# Patient Record
Sex: Male | Born: 1950 | Race: White | Hispanic: No | Smoking: Current every day smoker
Health system: Southern US, Community
[De-identification: ages and names within clinical notes are randomized; demographics above are authoritative.]

## PROBLEM LIST (undated history)

## (undated) ENCOUNTER — Emergency Department (HOSPITAL_COMMUNITY): Admission: EM | Payer: Self-pay | Source: Home / Self Care

## (undated) DIAGNOSIS — I10 Essential (primary) hypertension: Secondary | ICD-10-CM

---

## 1999-02-10 ENCOUNTER — Ambulatory Visit (HOSPITAL_COMMUNITY): Admission: RE | Admit: 1999-02-10 | Discharge: 1999-02-10 | Payer: Self-pay | Admitting: Endocrinology

## 1999-02-10 ENCOUNTER — Encounter: Payer: Self-pay | Admitting: Endocrinology

## 1999-02-11 ENCOUNTER — Ambulatory Visit (HOSPITAL_COMMUNITY): Admission: RE | Admit: 1999-02-11 | Discharge: 1999-02-11 | Payer: Self-pay | Admitting: Endocrinology

## 1999-02-11 ENCOUNTER — Encounter: Payer: Self-pay | Admitting: Endocrinology

## 2000-05-17 ENCOUNTER — Other Ambulatory Visit: Admission: RE | Admit: 2000-05-17 | Discharge: 2000-05-17 | Payer: Self-pay | Admitting: General Surgery

## 2002-12-26 ENCOUNTER — Emergency Department (HOSPITAL_COMMUNITY): Admission: EM | Admit: 2002-12-26 | Discharge: 2002-12-26 | Payer: Self-pay | Admitting: Emergency Medicine

## 2002-12-26 ENCOUNTER — Encounter: Payer: Self-pay | Admitting: Emergency Medicine

## 2009-01-11 ENCOUNTER — Inpatient Hospital Stay (HOSPITAL_COMMUNITY): Admission: RE | Admit: 2009-01-11 | Discharge: 2009-01-13 | Payer: Self-pay | Admitting: *Deleted

## 2009-01-11 ENCOUNTER — Ambulatory Visit: Payer: Self-pay | Admitting: *Deleted

## 2009-01-11 ENCOUNTER — Emergency Department (HOSPITAL_COMMUNITY): Admission: EM | Admit: 2009-01-11 | Discharge: 2009-01-11 | Payer: Self-pay | Admitting: Emergency Medicine

## 2010-10-20 LAB — HEPATIC FUNCTION PANEL
ALT: 43 U/L (ref 0–53)
AST: 33 U/L (ref 0–37)
AST: 39 U/L — ABNORMAL HIGH (ref 0–37)
Albumin: 3.8 g/dL (ref 3.5–5.2)
Albumin: 4.1 g/dL (ref 3.5–5.2)
Alkaline Phosphatase: 49 U/L (ref 39–117)
Bilirubin, Direct: 0.2 mg/dL (ref 0.0–0.3)
Indirect Bilirubin: 0.8 mg/dL (ref 0.3–0.9)
Total Bilirubin: 1 mg/dL (ref 0.3–1.2)
Total Bilirubin: 1 mg/dL (ref 0.3–1.2)
Total Protein: 6.6 g/dL (ref 6.0–8.3)
Total Protein: 7 g/dL (ref 6.0–8.3)

## 2010-10-20 LAB — CBC
MCV: 108.5 fL — ABNORMAL HIGH (ref 78.0–100.0)
Platelets: 171 10*3/uL (ref 150–400)
RBC: 4.72 MIL/uL (ref 4.22–5.81)
WBC: 11.8 10*3/uL — ABNORMAL HIGH (ref 4.0–10.5)

## 2010-10-20 LAB — BASIC METABOLIC PANEL
BUN: 17 mg/dL (ref 6–23)
Chloride: 108 mEq/L (ref 96–112)
Creatinine, Ser: 0.88 mg/dL (ref 0.4–1.5)
GFR calc Af Amer: >60 mL/min (ref >60)
GFR calc non Af Amer: >60 mL/min (ref >60)

## 2010-10-20 LAB — RAPID URINE DRUG SCREEN, HOSP PERFORMED
Amphetamines: NOT DETECTED
Barbiturates: NOT DETECTED
Benzodiazepines: NOT DETECTED
Cocaine: NOT DETECTED
Opiates: NOT DETECTED
Tetrahydrocannabinol: NOT DETECTED

## 2010-10-20 LAB — ETHANOL: Alcohol, Ethyl (B): 55 mg/dL — ABNORMAL HIGH (ref 0–10)

## 2010-10-20 LAB — DIFFERENTIAL
Eosinophils Absolute: 0.2 10*3/uL (ref 0.0–0.7)
Lymphocytes Relative: 20 % (ref 12–46)
Lymphs Abs: 2.4 10*3/uL (ref 0.7–4.0)
Monocytes Relative: 5 % (ref 3–12)
Neutro Abs: 8.6 10*3/uL — ABNORMAL HIGH (ref 1.7–7.7)
Neutrophils Relative %: 73 % (ref 43–77)

## 2010-10-20 LAB — SALICYLATE LEVEL: Salicylate Lvl: <4 mg/dL (ref 2.8–20.0)

## 2010-10-20 LAB — ACETAMINOPHEN LEVEL: Acetaminophen (Tylenol), Serum: <10 ug/mL — ABNORMAL LOW (ref 10–30)

## 2010-10-20 LAB — TSH: TSH: 1.703 u[IU]/mL (ref 0.350–4.500)

## 2010-10-20 LAB — TRICYCLICS SCREEN, URINE: TCA Scrn: POSITIVE — AB

## 2010-11-28 NOTE — Discharge Summary (Signed)
NAME:  Ricky [facility], RADELL NO.:  [id number]   MEDICAL RECORD NO.:  [id number]          PATIENT TYPE:  IPS   LOCATION:  0301                          FACILITY:  [facility]   PHYSICIAN:  Geoffery Lyons, M.D.      DATE OF BIRTH:  [birth year]   DATE OF ADMISSION:  01/11/2009  DATE OF DISCHARGE:  01/13/2009                               DISCHARGE SUMMARY   IDENTIFYING INFORMATION:  A 60 year old single male.  This is a  voluntary admission.   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  He presented after overdosing on  approximately 32 tablets of Unisom, an over-the-counter sleeping pill.  He endorsed that it was a suicide attempt.  He had had thoughts of  suicide for a couple days, feeling that the previous weeks' events had  pushed him over the edge.  He took the overdose at night, and his  brother found him confused the following morning.  He endorsed that he  had drunk some alcohol.  He reported that he had had a lot of financial  stressors.  His house was being foreclosed on because he had not gotten  any money out of the business over the past year, missed the mortgage,  thought that he had a plan in place to have the mortgage changed then  discovered that he had missed a deadline.  Then his ex-wife got in touch  with him and was suing him for lack of payment on his part of an equity  line that they owed and initially thought that it was 67,000 dollars,  feeling unable to cope with these stressors and that he could no longer  live.   PAST PSYCHIATRIC HISTORY:  Remarkable for a history of alcohol use,  typically drinking two beers a day near the end of the day as a way to  relax.  No issues with intoxication.  He had a distant history of a  previous DWI, none in several years.  No  prior history of suicide  attempts.  No history of prior psychiatric treatment or counseling.   LABORATORIES:  He was medically stabilized in the emergency room where a  urine drug screen was negative for all substances.  Electrolytes within  normal limits.  CBC:  WBC 11.8, hemoglobin 17.5, hematocrit 51.2 and MCV  108.5.  Liver enzymes revealed a mildly elevated SGOT of 39, normal  SGOT, alkaline phosphatase and total bilirubin.   DRUG ALLERGIES:  None.   ADMITTING MEDICATIONS:  1. Diovan 320 mg once daily.  2. Atenolol 25 mg b.i.d.   ADMITTING MENTAL STATUS EXAM:  Revealed a fully alert male, cooperative,  rather embarrassed by what he did, denying any dangerous thoughts.   HOSPITAL COURSE:  On reflection, he thought that he did not owe as much  money as he initially had complained of owing.  He still had  considerable stressors with a  large amount of money on the equity line  that he owed his wife.  He said that there would be no way he could go  back to his home, that it would definitely be foreclosed on.  He felt  encouraged by the fact that he had spoken with his father who wanted him  to come and live with him and stay in the spare room.  His brother was  also supportive.  He felt like his family was there for him.  By the  4th, he was fully alert.  No signs of alcohol withdrawal.  He was  requesting to be discharged.  No dangerous ideas.  Family was  supportive, wanted him to stay with them.  He was agreeable to consider  counseling.  Endorsed that he was going to stay away from alcohol.  He  was not started on any medications.   DISCHARGE MENTAL STATUS EXAM:  Revealed a fully alert male, fully  oriented.  No dangerous ideas.  No evidence of delirium or withdrawal.   DISCHARGE DIAGNOSES:  AXIS I:  Depressive disorder NOS.  Alcohol  intoxication.  AXIS II:  No diagnosis.  AXIS III:  Hypertension, status post diphenhydramine overdose.  AXIS IV: Significant financial  issues and business losses.  Supportive  family is an asset.  AXIS V:  Current GAF 62, past year 41.   DISCHARGE MEDICATIONS:  We discharged him home on his current  medications of atenolol 25 mg b.i.d. and Diovan 320 mg daily.  No new  medications started here.   FOLLOWUP:  He was agreeable to go to counseling, and our counselor will  call him with referral since this is the July 4th holiday.      Margaret A. Scott, N.P.      Geoffery Lyons, M.D.  Electronically Signed    MAS/MEDQ  D:  01/14/2009  T:  01/14/2009  Job:  [id number]

## 2012-09-01 ENCOUNTER — Emergency Department (HOSPITAL_COMMUNITY): Payer: Self-pay

## 2012-09-01 ENCOUNTER — Inpatient Hospital Stay (HOSPITAL_COMMUNITY)
Admission: EM | Admit: 2012-09-01 | Discharge: 2012-09-05 | DRG: 071 | Disposition: A | Discharge status: Home / Self Care | Payer: Self-pay | Attending: Internal Medicine | Admitting: Internal Medicine

## 2012-09-01 ENCOUNTER — Encounter (HOSPITAL_COMMUNITY): Payer: Self-pay | Admitting: *Deleted

## 2012-09-01 DIAGNOSIS — F1910 Other psychoactive substance abuse, uncomplicated: Secondary | ICD-10-CM | POA: Diagnosis present

## 2012-09-01 DIAGNOSIS — F1020 Alcohol dependence, uncomplicated: Secondary | ICD-10-CM | POA: Diagnosis present

## 2012-09-01 DIAGNOSIS — E876 Hypokalemia: Secondary | ICD-10-CM | POA: Diagnosis present

## 2012-09-01 DIAGNOSIS — F10239 Alcohol dependence with withdrawal, unspecified: Secondary | ICD-10-CM

## 2012-09-01 DIAGNOSIS — Z79899 Other long term (current) drug therapy: Secondary | ICD-10-CM

## 2012-09-01 DIAGNOSIS — D7589 Other specified diseases of blood and blood-forming organs: Secondary | ICD-10-CM

## 2012-09-01 DIAGNOSIS — G9340 Encephalopathy, unspecified: Principal | ICD-10-CM | POA: Diagnosis present

## 2012-09-01 DIAGNOSIS — F10939 Alcohol use, unspecified with withdrawal, unspecified: Secondary | ICD-10-CM | POA: Diagnosis present

## 2012-09-01 DIAGNOSIS — D6959 Other secondary thrombocytopenia: Secondary | ICD-10-CM | POA: Diagnosis present

## 2012-09-01 DIAGNOSIS — Z7982 Long term (current) use of aspirin: Secondary | ICD-10-CM

## 2012-09-01 DIAGNOSIS — D696 Thrombocytopenia, unspecified: Secondary | ICD-10-CM | POA: Diagnosis present

## 2012-09-01 HISTORY — DX: Essential (primary) hypertension: I10

## 2012-09-01 LAB — COMPREHENSIVE METABOLIC PANEL
ALT: 62 U/L — ABNORMAL HIGH (ref 0–53)
ALT: 57 U/L — ABNORMAL HIGH (ref 0–53)
Alkaline Phosphatase: 136 U/L — ABNORMAL HIGH (ref 39–117)
BUN: 14 mg/dL (ref 6–23)
BUN: 21 mg/dL (ref 6–23)
CO2: 23 mEq/L (ref 19–32)
Calcium: 9.1 mg/dL (ref 8.4–10.5)
Calcium: 8.9 mg/dL (ref 8.4–10.5)
GFR calc Af Amer: >90 mL/min (ref >90)
GFR calc Af Amer: >90 mL/min (ref >90)
GFR calc non Af Amer: >90 mL/min (ref >90)
Glucose, Bld: 78 mg/dL (ref 70–99)
Glucose, Bld: 80 mg/dL (ref 70–99)
Potassium: 3.3 mEq/L — ABNORMAL LOW (ref 3.5–5.1)
Sodium: 136 mEq/L (ref 135–145)
Sodium: 140 mEq/L (ref 135–145)
Total Protein: 6.9 g/dL (ref 6.0–8.3)

## 2012-09-01 LAB — RAPID URINE DRUG SCREEN, HOSP PERFORMED
Amphetamines: NOT DETECTED
Benzodiazepines: NOT DETECTED
Opiates: NOT DETECTED

## 2012-09-01 LAB — CBC WITH DIFFERENTIAL/PLATELET
Basophils Absolute: 0 10*3/uL (ref 0.0–0.1)
Basophils Relative: 0 % (ref 0–1)
Eosinophils Relative: 0 % (ref 0–5)
Lymphocytes Relative: 11 % — ABNORMAL LOW (ref 12–46)
MCHC: 36.1 g/dL — ABNORMAL HIGH (ref 30.0–36.0)
Monocytes Relative: 14 % — ABNORMAL HIGH (ref 3–12)
Neutro Abs: 5.8 10*3/uL (ref 1.7–7.7)
Neutrophils Relative %: 79 % — ABNORMAL HIGH (ref 43–77)
Neutrophils Relative %: 62 % (ref 43–77)
Platelets: 95 10*3/uL — ABNORMAL LOW (ref 150–400)
RBC: 3.73 MIL/uL — ABNORMAL LOW (ref 4.22–5.81)
RDW: 12 % (ref 11.5–15.5)
WBC: 7.4 10*3/uL (ref 4.0–10.5)
WBC: 6.2 10*3/uL (ref 4.0–10.5)

## 2012-09-01 LAB — APTT: aPTT: 31 seconds (ref 24–37)

## 2012-09-01 LAB — URINALYSIS, ROUTINE W REFLEX MICROSCOPIC
Glucose, UA: NEGATIVE mg/dL
Hgb urine dipstick: NEGATIVE
Protein, ur: 100 mg/dL — AB
Specific Gravity, Urine: 1.026 (ref 1.005–1.030)
pH: 5 (ref 5.0–8.0)

## 2012-09-01 LAB — MAGNESIUM
Magnesium: 1.7 mg/dL (ref 1.5–2.5)
Magnesium: 1.8 mg/dL (ref 1.5–2.5)

## 2012-09-01 LAB — ETHANOL: Alcohol, Ethyl (B): <11 mg/dL (ref 0–11)

## 2012-09-01 LAB — ACETAMINOPHEN LEVEL: Acetaminophen (Tylenol), Serum: <15 ug/mL (ref 10–30)

## 2012-09-01 LAB — PHOSPHORUS: Phosphorus: 4.1 mg/dL (ref 2.3–4.6)

## 2012-09-01 LAB — PROTIME-INR
INR: 1.14 (ref 0.00–1.49)
INR: 1.13 (ref 0.00–1.49)

## 2012-09-01 LAB — URINE MICROSCOPIC-ADD ON

## 2012-09-01 MED ORDER — LACTULOSE 10 GM/15ML PO SOLN
10.0000 g | Freq: Two times a day (BID) | ORAL | Status: DC | PRN
  Filled 2012-09-01: qty 15

## 2012-09-01 MED ORDER — OMEGA-3 FATTY ACIDS 1000 MG PO CAPS: 1.0000 g | ORAL_CAPSULE | Freq: Three times a day (TID) | ORAL | Status: DC

## 2012-09-01 MED ORDER — LORAZEPAM 2 MG/ML IJ SOLN: 2.0000 mg | Freq: Four times a day (QID) | INTRAMUSCULAR | Status: DC | PRN

## 2012-09-01 MED ORDER — POTASSIUM CHLORIDE CRYS ER 20 MEQ PO TBCR: 40.0000 meq | EXTENDED_RELEASE_TABLET | Freq: Once | ORAL | Status: DC

## 2012-09-01 MED ORDER — POTASSIUM CHLORIDE CRYS ER 20 MEQ PO TBCR
40.0000 meq | EXTENDED_RELEASE_TABLET | Freq: Once | ORAL | Status: AC
  Administered 2012-09-01: 40 meq via ORAL
  Filled 2012-09-01: qty 2

## 2012-09-01 MED ORDER — VITAMIN B-1 100 MG PO TABS
100.0000 mg | ORAL_TABLET | Freq: Every day | ORAL | Status: DC
  Administered 2012-09-02 – 2012-09-05 (×4): 100 mg via ORAL
  Filled 2012-09-01 (×4): qty 1

## 2012-09-01 MED ORDER — FOLIC ACID 1 MG PO TABS
1.0000 mg | ORAL_TABLET | Freq: Every day | ORAL | Status: DC
  Administered 2012-09-04: 1 mg via ORAL
  Filled 2012-09-01 (×4): qty 1

## 2012-09-01 MED ORDER — ADULT MULTIVITAMIN W/MINERALS CH
1.0000 | ORAL_TABLET | Freq: Every day | ORAL | Status: DC
  Administered 2012-09-01 – 2012-09-05 (×5): 1 via ORAL
  Filled 2012-09-01 (×5): qty 1

## 2012-09-01 MED ORDER — ACETAMINOPHEN 650 MG RE SUPP: 650.0000 mg | Freq: Four times a day (QID) | RECTAL | Status: DC | PRN

## 2012-09-01 MED ORDER — LORAZEPAM 1 MG PO TABS: 1.0000 mg | ORAL_TABLET | Freq: Four times a day (QID) | ORAL | Status: DC | PRN

## 2012-09-01 MED ORDER — THIAMINE HCL 100 MG/ML IJ SOLN
100.0000 mg | Freq: Every day | INTRAMUSCULAR | Status: DC
  Administered 2012-09-01: 100 mg via INTRAVENOUS
  Filled 2012-09-01: qty 1
  Filled 2012-09-01: qty 2

## 2012-09-01 MED ORDER — LORAZEPAM 2 MG/ML IJ SOLN
0.0000 mg | Freq: Four times a day (QID) | INTRAMUSCULAR | Status: AC
  Administered 2012-09-01: 4 mg via INTRAVENOUS
  Administered 2012-09-02: 1 mg via INTRAVENOUS
  Administered 2012-09-02 (×3): 2 mg via INTRAVENOUS
  Administered 2012-09-03: 3 mg via INTRAVENOUS
  Administered 2012-09-03: 2 mg via INTRAVENOUS
  Filled 2012-09-01 (×2): qty 1
  Filled 2012-09-01: qty 2
  Filled 2012-09-01 (×3): qty 1
  Filled 2012-09-01: qty 2
  Filled 2012-09-01: qty 1

## 2012-09-01 MED ORDER — B COMPLEX-C PO TABS
1.0000 | ORAL_TABLET | Freq: Every day | ORAL | Status: DC
  Administered 2012-09-01 – 2012-09-05 (×5): 1 via ORAL
  Filled 2012-09-01 (×5): qty 1

## 2012-09-01 MED ORDER — ADULT MULTIVITAMIN W/MINERALS CH
1.0000 | ORAL_TABLET | Freq: Every day | ORAL | Status: DC
  Filled 2012-09-01: qty 1

## 2012-09-01 MED ORDER — ADULT MULTIVITAMIN W/MINERALS CH
1.0000 | ORAL_TABLET | Freq: Once | ORAL | Status: AC
  Administered 2012-09-01: 1 via ORAL
  Filled 2012-09-01: qty 1

## 2012-09-01 MED ORDER — THIAMINE HCL 100 MG/ML IJ SOLN
100.0000 mg | Freq: Every day | INTRAMUSCULAR | Status: DC
  Filled 2012-09-01 (×4): qty 1

## 2012-09-01 MED ORDER — FOLIC ACID 5 MG/ML IJ SOLN
1.0000 mg | Freq: Once | INTRAMUSCULAR | Status: AC
  Administered 2012-09-01: 1 mg via INTRAVENOUS
  Filled 2012-09-01: qty 0.2

## 2012-09-01 MED ORDER — THIAMINE HCL 100 MG/ML IJ SOLN
100.0000 mg | Freq: Every day | INTRAMUSCULAR | Status: DC
  Filled 2012-09-01: qty 1

## 2012-09-01 MED ORDER — LORAZEPAM 2 MG/ML IJ SOLN
1.0000 mg | Freq: Four times a day (QID) | INTRAMUSCULAR | Status: DC | PRN
  Administered 2012-09-01: 1 mg via INTRAVENOUS
  Filled 2012-09-01: qty 1

## 2012-09-01 MED ORDER — ATENOLOL 25 MG PO TABS
25.0000 mg | ORAL_TABLET | Freq: Two times a day (BID) | ORAL | Status: DC
  Administered 2012-09-02 – 2012-09-03 (×4): 25 mg via ORAL
  Filled 2012-09-01 (×7): qty 1

## 2012-09-01 MED ORDER — LORAZEPAM 1 MG PO TABS
1.0000 mg | ORAL_TABLET | Freq: Four times a day (QID) | ORAL | Status: DC | PRN
  Administered 2012-09-04: 1 mg via ORAL
  Filled 2012-09-01: qty 1

## 2012-09-01 MED ORDER — VITAMIN B-1 100 MG PO TABS
100.0000 mg | ORAL_TABLET | Freq: Every day | ORAL | Status: DC
  Filled 2012-09-01: qty 1

## 2012-09-01 MED ORDER — NIACIN 500 MG PO TABS
500.0000 mg | ORAL_TABLET | Freq: Two times a day (BID) | ORAL | Status: DC
  Administered 2012-09-01 – 2012-09-05 (×8): 500 mg via ORAL
  Filled 2012-09-01 (×11): qty 1

## 2012-09-01 MED ORDER — SODIUM CHLORIDE 0.9 % IJ SOLN
3.0000 mL | Freq: Two times a day (BID) | INTRAMUSCULAR | Status: DC
  Administered 2012-09-01 – 2012-09-04 (×4): 3 mL via INTRAVENOUS

## 2012-09-01 MED ORDER — LORAZEPAM 2 MG/ML IJ SOLN
0.0000 mg | Freq: Two times a day (BID) | INTRAMUSCULAR | Status: DC
  Administered 2012-09-03: 2 mg via INTRAVENOUS
  Filled 2012-09-01: qty 2

## 2012-09-01 MED ORDER — THIAMINE HCL 100 MG/ML IJ SOLN: Freq: Once | INTRAVENOUS | Status: DC

## 2012-09-01 MED ORDER — SODIUM CHLORIDE 0.9 % IV SOLN
INTRAVENOUS | Status: DC
  Administered 2012-09-01 – 2012-09-03 (×4): via INTRAVENOUS

## 2012-09-01 MED ORDER — ONDANSETRON HCL 4 MG/2ML IJ SOLN: 4.0000 mg | Freq: Four times a day (QID) | INTRAMUSCULAR | Status: DC | PRN

## 2012-09-01 MED ORDER — LORAZEPAM 2 MG/ML IJ SOLN: 1.0000 mg | Freq: Four times a day (QID) | INTRAMUSCULAR | Status: DC | PRN

## 2012-09-01 MED ORDER — LORAZEPAM 2 MG/ML IJ SOLN
2.0000 mg | INTRAMUSCULAR | Status: DC | PRN
  Administered 2012-09-01 – 2012-09-03 (×9): 2 mg via INTRAVENOUS
  Filled 2012-09-01 (×6): qty 1
  Filled 2012-09-01: qty 2

## 2012-09-01 MED ORDER — FOLIC ACID 1 MG PO TABS
1.0000 mg | ORAL_TABLET | Freq: Every day | ORAL | Status: DC
  Administered 2012-09-02 – 2012-09-05 (×3): 1 mg via ORAL
  Filled 2012-09-01 (×4): qty 1

## 2012-09-01 MED ORDER — ONDANSETRON HCL 4 MG PO TABS: 4.0000 mg | ORAL_TABLET | Freq: Four times a day (QID) | ORAL | Status: DC | PRN

## 2012-09-01 MED ORDER — ASPIRIN EC 81 MG PO TBEC
81.0000 mg | DELAYED_RELEASE_TABLET | Freq: Every day | ORAL | Status: DC
Start: 2012-09-01 — End: 2012-09-05
  Administered 2012-09-01 – 2012-09-05 (×5): 81 mg via ORAL
  Filled 2012-09-01 (×5): qty 1

## 2012-09-01 MED ORDER — ACETAMINOPHEN 325 MG PO TABS: 650.0000 mg | ORAL_TABLET | Freq: Four times a day (QID) | ORAL | Status: DC | PRN

## 2012-09-01 MED ORDER — LORAZEPAM 1 MG PO TABS
1.0000 mg | ORAL_TABLET | Freq: Four times a day (QID) | ORAL | Status: DC | PRN
  Administered 2012-09-01: 1 mg via ORAL
  Filled 2012-09-01 (×2): qty 1

## 2012-09-01 MED ORDER — OMEGA-3-ACID ETHYL ESTERS 1 G PO CAPS
1.0000 g | ORAL_CAPSULE | Freq: Three times a day (TID) | ORAL | Status: DC
  Administered 2012-09-02 – 2012-09-05 (×10): 1 g via ORAL
  Filled 2012-09-01 (×13): qty 1

## 2012-09-01 NOTE — ED Notes (Signed)
AOZ:[id number]<VH> Expected date:09/01/12<BR> Expected time: 8:17 AM<BR> Means of arrival:Ambulance<BR> Comments:<BR> Confused found walking out side

## 2012-09-01 NOTE — ED Notes (Signed)
Per EMS: pt seen walking outside in his underwear, neighbor called [employer], per EMS pt is having visual hallucinations, pt reports seeing dogs, snow falling etc. Per EMS pt admits to being alcoholic, last drink last night, per EMS pt's sister reports no known psych issues.

## 2012-09-01 NOTE — Progress Notes (Signed)
CM noted CM consult for trouble with medications Pt is listed as self pay but states he has a pcp CM spoke with pt, his sister and his brother prior to transfer to unit Pt states also he has seen Dr Casimiro Needle rigby Reports today being "foggy in understanding"   Cm answered questions for sister about health reform deadline of 10/10/12 Cm reviewed with pt, sister and brother the chs [facility] program and that pt may be determined to be a candidate at time of d/c and offered d/c medications for co pay of $3 per Rx.  CM reviewed needymeds.org site for assess to patient assistance programs for uninsured patients CM provided written information for needymeds.org and the pt assistance applications for diovan and atenolol Pt has a bag of medications with him His sister clarifies that pt takes only 2 Rx meds (diovan and atenolol) and vitamins but he had stopped taking the 2 Rx medications related to cost after he lost his job He previously obtained them for $4 cost but increased to $30-60 per pt

## 2012-09-01 NOTE — ED Provider Notes (Signed)
History     CSN: [id number]  Arrival date & time 09/01/12  4098   First MD Initiated Contact with Patient 09/01/12 [phone]      Chief Complaint  Patient presents with  . Altered Mental Status    (Consider location/radiation/quality/duration/timing/severity/associated sxs/prior treatment) HPI Comments: 62 y.o male PMH alcohol abuse, h/o drug overdose, h/o SI, h/o HTN.  He presents for AMS.  He was found walking down the street in his underwear. His neighbor called police.  EMS stated the patient was seeing dogs and snowflakes.  Sister says she spoke with the patient yesterday and he sounded confused.   The patient stated he has a recent history of fall (a couple of days ago) walking the dog remembers hitting his arm and falling on right hip and knee but he is unsure if he hit his head.  He states today he is hallucinating seeing snowflakes today.  He had the shakes yesterday and today.  He states he has been off balance, more confused, slurred speech.  He reports he drinks daily, never been to rehab or AA.  CAGE questions: (Cut back-yes, sister annoyed, guilty-no, eyeopener-yes).  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  He drinks beer, wine and liquor.  Last night he drank 6 beers (last drink 8:30 or 9 PM).  He states 1 week ago he stopped drinking Vodka and he would drink 2 bottles in 1 week.  He last had wine 3-4 days ago.   SH: 1 ppd smoker, drinking since age 60. Sister and brother live in the area; divorced. No kids.  DUI x 3 (last 7 months ago). Without license. +financial stressors. Unemployed. PCP: Eldridge Abrahams [facility].  Meds: Taking Atenolol 25 mg bid.  Used to take Diovan but d/c due to cost   The history is provided by the patient (RN spoke with sister over the phone). No language interpreter was used.    Past Medical History  Diagnosis Date  . Hypertension     History reviewed. No  pertinent past surgical history.  History reviewed. No pertinent family history.  History  Substance Use Topics  . Smoking status: Not on file  . Smokeless tobacco: Not on file  . Alcohol Use: Not on file      Review of Systems  HENT:       +dry throat   Respiratory: Positive for shortness of breath.        Sob this am with exertion  Cardiovascular: Negative for chest pain.  Gastrointestinal: Negative for blood in stool.  Genitourinary: Negative for dysuria and hematuria.  Musculoskeletal: Positive for gait problem.  Neurological: Positive for speech difficulty. Negative for headaches.       +slurred speech +memory loss (i.e names) +abnormal balance  Psychiatric/Behavioral: Positive for hallucinations. Negative for suicidal ideas.       Denies SI.  But then says depends on how much  The bill will cost  Seeing snowflakes, denies AH   All other systems reviewed and are negative.    Allergies  Review of patient's allergies indicates not on file.  Home Medications  No current outpatient prescriptions on file.  BP 122/73  Pulse 95  Temp(Src) 98.6 F (37 C) (Rectal)  Resp 16  SpO2 98%  Physical Exam  Nursing note and vitals reviewed. Constitutional: He is oriented to person, place, and time. Vital signs are normal. He appears well-developed and well-nourished.  He is cooperative. No distress.  HENT:  Head: Normocephalic and atraumatic.  Mouth/Throat: Oropharynx is clear and moist and mucous membranes are normal. No oropharyngeal exudate.  Eyes: Conjunctivae are normal. Pupils are equal, round, and reactive to light. Right eye exhibits no discharge. Left eye exhibits no discharge. Scleral icterus is present.  Mild scleral icterus   Cardiovascular: Normal rate, regular rhythm, S1 normal, S2 normal and normal heart sounds.   No murmur heard. Pulmonary/Chest: Effort normal and breath sounds normal. No respiratory distress. He has no wheezes.  Abdominal: Soft. Bowel  sounds are normal. He exhibits no distension. There is no tenderness.  Musculoskeletal: He exhibits no edema.  Neurological: He is alert and oriented to person, place, and time. No cranial nerve deficit. Coordination normal.  Mild tremors b/l arms  Negative rhomberg Alert and oriented x 3   Skin: Skin is warm and dry. Abrasion noted. No rash noted. He is not diaphoretic.  Healing abrasions to right forearm   Psychiatric: He has a normal mood and affect. His speech is normal and behavior is normal. Judgment and thought content normal.    ED Course  Procedures (including critical care time)  Labs Reviewed  COMPREHENSIVE METABOLIC PANEL - Abnormal; Notable for the following:    Potassium 3.3 (*)    Chloride 93 (*)    AST 127 (*)    ALT 62 (*)    Alkaline Phosphatase 136 (*)    Total Bilirubin 2.9 (*)    All other components within normal limits  CBC WITH DIFFERENTIAL - Abnormal; Notable for the following:    RBC 4.09 (*)    MCV 109.0 (*)    MCH 39.4 (*)    MCHC 36.1 (*)    Platelets 96 (*)    Neutrophils Relative 79 (*)    Lymphocytes Relative 11 (*)    All other components within normal limits  ETHANOL  URINE RAPID DRUG SCREEN (HOSP PERFORMED)  ACETAMINOPHEN LEVEL  MAGNESIUM  PHOSPHORUS  PROTIME-INR  URINALYSIS, ROUTINE W REFLEX MICROSCOPIC  AMMONIA   Ct Head Wo Contrast  09/01/2012  *RADIOLOGY REPORT*  Clinical Data: Confusion with altered mental status and hallucinations.  CT HEAD WITHOUT CONTRAST  Technique:  Contiguous axial images were obtained from the base of the skull through the vertex without contrast.  Comparison: Report only from prior study 12/26/2002.  Findings: There is no evidence of acute intracranial hemorrhage, mass lesion, brain edema or extra-axial fluid collection.  The ventricles and subarachnoid spaces are appropriately sized for age. There is no CT evidence of acute cortical infarction.  A small calcification is noted along the tentorium on the left.   The visualized paranasal sinuses are clear. The calvarium is intact.  IMPRESSION: No acute or significant findings.   Original Report Authenticated By: Carey Bullocks, M.D.    Dg Chest Port 1 View  09/01/2012  *RADIOLOGY REPORT*  Clinical Data: Shortness of breath with shaking.  PORTABLE CHEST - 1 VIEW  Comparison: None.  Findings: 0930 hours. The heart size and mediastinal contours are normal. The lungs are clear. There is no pleural effusion or pneumothorax. No acute osseous findings are identified.  IMPRESSION: No active cardiopulmonary process.   Original Report Authenticated By: Carey Bullocks, M.D.      1. Acute encephalopathy   2. Hypokalemia   3. Thrombocytopenia   4. Macrocytosis without anemia   5. Elevated transaminase level      Date: 09/01/2012  Rate: 84  Rhythm: normal sinus rhythm  QRS Axis: normal  Intervals: normal  ST/T Wave abnormalities: normal  Conduction Disutrbances:none  Narrative Interpretation:   Old EKG Reviewed: unchanged    MDM  Acute encephalopathy concern for alcohol withdrawal/intoxication Other dDx: infection, other intoxication  Initial CIWA score 17, CIWA protocol CMET, CBC, acetaminophen, ethanol, UA, UDS, CXR, INR, ammonia Folic acid iv, thiamine iv, mvt po, NS Hypokalemia 3.3-40 mEQ x 1 AG=20   Shirlee Latch MD [phone]         Annett Gula, MD 09/01/12 1057  Annett Gula, MD 09/01/12 1135

## 2012-09-01 NOTE — Progress Notes (Signed)
Pt listed without coverage or pcp Referral made to [facility] community care liaison who spoke with pt He states his pcp is at [facility] [place] and he sees PA Roxanne Mins primarily Confirms he is [place] uninsured patient EPIC updated
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
## 2012-09-01 NOTE — H&P (Addendum)
[facility] History and Physical  Travante Knee ZOX:[id number] DOB: [birth year] DOA: 09/01/2012  Referring physician: ER physician PCP: Coralee Rud, PA   Chief Complaint: Altered mental status.  HPI:  62 y.o male with past medical history including but not limited to alcohol abuse, drug abuse, hypertension who presented to [facility] for altered mental status. Per EMS report, patient was found walking in his underwear down the street. Apparently patient had hallucinations at that time per neighbor report. In ED, patient is more alert. He reports feeling dizzy and with some hallucinations. His last alcoholic drink was last night, 6 beers. At this time patient reports no chest pain, he does have some shortness of breath but no palpitations. No reports of abdominal pain, nausea or vomiting. No fever, cough. No reports of blood in the stool or urine. In ED, patient was found to have thrombocytopenia with platelet count of 96. His potassium was 3.3.  Assessment and plan:  Principal Problem:   Acute encephalopathy  Secondary to alcohol withdrawal  Alcohol level is within normal limits on admission  Ammonia level 65  CIWA protocol in place  Continue supportive care with IV fluids, antiemetics  Lactulose twice a day when necessary  PT evaluation Active Problems:   Thrombocytopenia  Likely secondary to history of alcohol abuse  No signs of active bleed  Continue to monitor   Hypokalemia  Repleted in ED  Followup BMP in the morning   Code Status: Full Family Communication: Pt at bedside Disposition Plan: Admit for further evaluation  Manson Passey, MD  [facility] Pager [phone]  If 7PM-7AM, please contact night-coverage [email or web address] Password [email or web address] 09/01/2012, 12:31 PM  Review of Systems:  Constitutional: Negative for fever, chills and malaise/fatigue. Negative for diaphoresis.  HENT: Negative for hearing loss, ear pain, nosebleeds, congestion, positive sore  throat, no neck pain, tinnitus or ear discharge.   Eyes: Negative for blurred vision, double vision, photophobia, pain, discharge and redness.  Respiratory: Positive for shortness of breath, no cough, no hemoptysis, no sputum production.   Cardiovascular: Negative for chest pain, palpitations, orthopnea, claudication and leg swelling.  Gastrointestinal: Negative for nausea, vomiting and abdominal pain. Negative for heartburn, constipation, blood in stool and melena.  Genitourinary: Negative for dysuria, urgency, frequency, hematuria and flank pain.  Musculoskeletal: Negative for myalgias, back pain, joint pain and falls.  Skin: Negative for itching and rash.  Neurological: Negative for dizziness and weakness. Negative for tingling, tremors, sensory change, speech change, focal weakness, loss of consciousness and headaches.  Endo/Heme/Allergies: Negative for environmental allergies and polydipsia. Does not bruise/bleed easily.  Psychiatric/Behavioral: Negative for suicidal ideas. The patient is not nervous/anxious.      Past Medical History  Diagnosis Date  . Hypertension    History reviewed. No pertinent past surgical history. Social History:  has no tobacco, alcohol, and drug history on file.  No Known Allergies  Family History:  Family History  Problem Relation Age of Onset  . Heart disease Father   \  Prior to Admission medications   Medication Sig Start Date End Date Taking? Authorizing Provider  aspirin EC 81 MG tablet Take 81 mg by mouth daily.   Yes Historical Provider, MD  atenolol (TENORMIN) 50 MG tablet Take 25 mg by mouth 2 (two) times daily.   Yes Historical Provider, MD  B Complex-C (B-COMPLEX WITH VITAMIN C) tablet Take 1 tablet by mouth daily.   Yes Historical Provider, MD  fish oil-omega-3 fatty acids 1000 MG capsule Take 1 g by  mouth 3 (three) times daily.   Yes Historical Provider, MD  Multiple Vitamin (MULTIVITAMIN WITH MINERALS) TABS Take 1 tablet by mouth daily.    Yes Historical Provider, MD  niacin 500 MG tablet Take 500 mg by mouth 2 (two) times daily with a meal.   Yes Historical Provider, MD   Physical Exam: Filed Vitals:   09/01/12 0834 09/01/12 0941 09/01/12 1010 09/01/12 1154  BP:    105/72  Pulse:    95  Temp:  99.1 F (37.3 C) 98.6 F (37 C)   TempSrc:  Rectal    Resp:    16  SpO2: 98%   97%    Physical Exam  Constitutional: Appears in No distress.  HENT: Normocephalic. No tonsillar erythema or exudates Eyes: Conjunctivae and EOM are normal. PERRLA, no scleral icterus.  Neck: Normal ROM. Neck supple. No JVD. No tracheal deviation. No thyromegaly.  CVS: RRR, S1/S2 +, no murmurs, no gallops, no carotid bruit.  Pulmonary: Effort and breath sounds normal, no stridor, rhonchi, wheezes, rales.  Abdominal: Soft. BS +,  no distension, tenderness, rebound or guarding.  Musculoskeletal: Normal range of motion. No edema and no tenderness.  Lymphadenopathy: No lymphadenopathy noted, cervical, inguinal. Neuro: alert, no focal neurologic deficits Skin: Skin is warm and dry. No rash noted. Not diaphoretic. No erythema. No pallor.   Labs on Admission:  Basic Metabolic Panel:  Recent Labs Lab 09/01/12 0911  NA 136  K 3.3*  CL 93*  CO2 23  GLUCOSE 78  BUN 14  CREATININE 0.81  CALCIUM 9.1  MG 1.7  PHOS 4.1   Liver Function Tests:  Recent Labs Lab 09/01/12 0911  AST 127*  ALT 62*  ALKPHOS 136*  BILITOT 2.9*  PROT 7.5  ALBUMIN 3.7    Recent Labs Lab 09/01/12 1115  AMMONIA 65*   CBC:  Recent Labs Lab 09/01/12 0911  WBC 7.4  NEUTROABS 5.8  HGB 16.1  HCT 44.6  MCV 109.0*  PLT 96*   Radiological Exams on Admission: Ct Head Wo Contrast 09/01/2012  * IMPRESSION: No acute or significant findings.   Original Report Authenticated By: Carey Bullocks, M.D.    Dg Chest Port 1 View 09/01/2012   IMPRESSION: No active cardiopulmonary process.   Original Report Authenticated By: Carey Bullocks, M.D.    Time spent: 75  minutes
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
## 2012-09-01 NOTE — ED Provider Notes (Signed)
I saw and evaluated the patient, reviewed the resident's note and I agree with the findings and plan.  I personally evaluated the ECG and agree with the interpretation of the resident  Patient appears to be in alcohol withdrawal.  The patient be admitted the hospital.  Ativan in the ER.   Lyanne Co, MD 09/01/12 [phone]

## 2012-09-02 LAB — CBC
HCT: 39.4 % (ref 39.0–52.0)
Hemoglobin: 14.2 g/dL (ref 13.0–17.0)
MCHC: 36 g/dL (ref 30.0–36.0)

## 2012-09-02 LAB — COMPREHENSIVE METABOLIC PANEL
ALT: 50 U/L (ref 0–53)
AST: 96 U/L — ABNORMAL HIGH (ref 0–37)
Calcium: 8.2 mg/dL — ABNORMAL LOW (ref 8.4–10.5)
GFR calc Af Amer: >90 mL/min (ref >90)
Sodium: 140 mEq/L (ref 135–145)
Total Protein: 6.3 g/dL (ref 6.0–8.3)

## 2012-09-02 LAB — GLUCOSE, CAPILLARY

## 2012-09-02 MED ORDER — POTASSIUM CHLORIDE CRYS ER 20 MEQ PO TBCR
40.0000 meq | EXTENDED_RELEASE_TABLET | Freq: Once | ORAL | Status: AC
  Administered 2012-09-02: 40 meq via ORAL
  Filled 2012-09-02: qty 2

## 2012-09-02 NOTE — Progress Notes (Signed)
Pt has been placed back on telemetry.

## 2012-09-02 NOTE — Evaluation (Signed)
Physical Therapy Evaluation Patient Details Name: Ricky Kline MRN: [id number] DOB: 10-07-1950 Today's Date: 09/02/2012 Time: 8657-8469 PT Time Calculation (min): 29 min  PT Assessment / Plan / Recommendation Clinical Impression  62 y.o male with past medical history including but not limited to alcohol abuse, drug abuse, hypertension who presented to [facility] ED for altered mental status; Will benefit from continued PT to improve functional mobility and safety    PT Assessment  Patient needs continued PT services    Follow Up Recommendations       Does the patient have the potential to tolerate intense rehabilitation      Barriers to Discharge        Equipment Recommendations       Recommendations for Other Services     Frequency Min 3X/week    Precautions / Restrictions Precautions Precautions: Fall Precaution Comments: ETOH W/D   Pertinent Vitals/Pain Denies pain      Mobility  Bed Mobility Bed Mobility: Supine to Sit;Sit to Supine Supine to Sit: 4: Min assist;HOB flat Sit to Supine: 4: Min guard;HOB flat Transfers Transfers: Sit to Stand;Stand to Sit Sit to Stand: 3: Mod assist Stand to Sit: 4: Min assist;3: Mod assist Details for Transfer Assistance: unsteady, safety cues; RN available for safety Ambulation/Gait Ambulation/Gait Assistance: 1: +2 Total assist Ambulation Distance (Feet): 3 Feet Assistive device: 2 person hand held assist Ambulation/Gait Assistance Details: 3 lateral steps EOB    Exercises     PT Diagnosis: Difficulty walking  PT Problem List: Decreased strength;Decreased mobility;Decreased balance;Decreased coordination;Decreased safety awareness;Decreased activity tolerance PT Treatment Interventions: DME instruction;Gait training;Functional mobility training;Therapeutic activities;Therapeutic exercise;Patient/family education   PT Goals Acute Rehab PT Goals PT Goal Formulation: Patient unable to participate in goal setting Time  For Goal Achievement: 09/16/12 Potential to Achieve Goals: Good Pt will go Supine/Side to Sit: with supervision PT Goal: Supine/Side to Sit - Progress: Goal set today Pt will go Sit to Supine/Side: with supervision PT Goal: Sit to Supine/Side - Progress: Goal set today Pt will go Sit to Stand: with supervision PT Goal: Sit to Stand - Progress: Goal set today Pt will go Stand to Sit: with supervision PT Goal: Stand to Sit - Progress: Goal set today Pt will Ambulate: >150 feet;with supervision;with least restrictive assistive device PT Goal: Ambulate - Progress: Goal set today  Visit Information  Last PT Received On: 09/02/12    Subjective Data  Subjective: sleeping  Patient Stated Goal: home   Prior Functioning  Home Living Lives With: Alone Type of Home: Apartment Home Access: Level entry Home Layout: One level Home Adaptive Equipment: None Prior Function Level of Independence: Independent Communication Communication: No difficulties    Cognition  Cognition Overall Cognitive Status: Impaired Area of Impairment: Attention;Safety/judgement;Awareness of deficits;Memory Arousal/Alertness: Lethargic Orientation Level: Disoriented to;Place;Situation Behavior During Session: Restless Current Attention Level: Sustained Safety/Judgement: Decreased awareness of safety precautions;Decreased awareness of need for assistance;Impulsive    Extremity/Trunk Assessment Right Upper Extremity Assessment RUE ROM/Strength/Tone: [facility] for tasks assessed Left Upper Extremity Assessment LUE ROM/Strength/Tone: WFL for tasks assessed Right Lower Extremity Assessment RLE ROM/Strength/Tone: [facility] for tasks assessed Left Lower Extremity Assessment LLE ROM/Strength/Tone: WFL for tasks assessed (all extremities tremulous due to detox process)   Balance Dynamic Sitting Balance Dynamic Sitting - Balance Support:  (14 min EOB) Dynamic Sitting - Level of Assistance: 4: Min assist;5: Stand by  assistance Dynamic Sitting - Balance Activities: Lateral lean/weight shifting;Forward lean/weight shifting;Other (comment) (+washing face and taking meds ) Static Standing Balance Static  Standing - Balance Support: Bilateral upper extremity supported;During functional activity Static Standing - Level of Assistance: 3: Mod assist  End of Session PT - End of Session Activity Tolerance: Patient tolerated treatment well Patient left: in bed;with call bell/phone within reach;with bed alarm set  GP     [facility] 09/02/2012, 10:36 AM

## 2012-09-02 NOTE — Progress Notes (Signed)
UR completed 

## 2012-09-02 NOTE — Progress Notes (Signed)
Pt refusing telemetry at this time. Will continue to monitor pt and attempt to reapply telemetry as allowed.

## 2012-09-02 NOTE — Progress Notes (Signed)
[facility] PROGRESS NOTE  Ricky Kline [id number]:[id number] DOB: [birth year] DOA: 09/01/2012 PCP: Coralee Rud, PA  Assessment/Plan: Acute encephalopathy  Secondary to alcohol withdrawal  Alcohol level is within normal limits on admission  Ammonia level 65  CIWA protocol in place- requiring ATC and PRN  Continue supportive care with IV fluids, antiemetics  Lactulose twice a day when necessary  PT evaluation If patient worsens, will transfer to ICU for possible gtt and critical care consult  Thrombocytopenia  Likely secondary to history of alcohol abuse  No signs of active bleed  Continue to monitor   Hypokalemia  Repleted in ED  Followup BMP in the morning   Code Status: full Family Communication: patient at bedside Disposition Plan: home soon   Consultants:    Procedures:    Antibiotics:    HPI/Subjective: Patient having tremor  Objective: Filed Vitals:   09/01/12 1506 09/01/12 2000 09/01/12 2202 09/02/12 0643  BP: 139/81 140/80 148/81 137/84  Pulse: 94  108 87  Temp: 97.9 F (36.6 C)  98.6 F (37 C) 98.4 F (36.9 C)  TempSrc: Oral  Oral Oral  Resp: 18  20 16   Height: 5\' 9"  (1.753 m)     Weight: 81.9 kg (180 lb 8.9 oz)   81.8 kg (180 lb 5.4 oz)  SpO2: 94%  97% 99%    Intake/Output Summary (Last 24 hours) at 09/02/12 0928 Last data filed at 09/02/12 5784  Gross per 24 hour  Intake 1088.75 ml  Output    200 ml  Net 888.75 ml   Filed Weights   09/01/12 1506 09/02/12 0643  Weight: 81.9 kg (180 lb 8.9 oz) 81.8 kg (180 lb 5.4 oz)    Exam:   General:  A+Ox3, NAD  Cardiovascular: rrr  Respiratory: clear anterior  Abdomen: +BS, Soft, NT/ND  Data Reviewed: Basic Metabolic Panel:  Recent Labs Lab 09/01/12 0911 09/01/12 1557 09/02/12 0450  NA 136 140 140  K 3.3* 4.1 3.2*  CL 93* 99 101  CO2 23 27 22   GLUCOSE 78 80 85  BUN 14 21 18   CREATININE 0.81 0.96 0.58  CALCIUM 9.1 8.9 8.2*  MG 1.7 1.8  --   PHOS 4.1 4.4  --     Liver Function Tests:  Recent Labs Lab 09/01/12 0911 09/01/12 1557 09/02/12 0450  AST 127* 117* 96*  ALT 62* 57* 50  ALKPHOS 136* 121* 104  BILITOT 2.9* 2.3* 1.6*  PROT 7.5 6.9 6.3  ALBUMIN 3.7 3.2* 2.9*   No results found for this basename: LIPASE, AMYLASE,  in the last 168 hours  Recent Labs Lab 09/01/12 1115  AMMONIA 65*   CBC:  Recent Labs Lab 09/01/12 0911 09/01/12 1557 09/02/12 0450  WBC 7.4 6.2 5.2  NEUTROABS 5.8 3.8  --   HGB 16.1 14.6 14.2  HCT 44.6 41.3 39.4  MCV 109.0* 110.7* 111.3*  PLT 96* 95* 86*   Cardiac Enzymes: No results found for this basename: CKTOTAL, CKMB, CKMBINDEX, TROPONINI,  in the last 168 hours BNP (last 3 results) No results found for this basename: PROBNP,  in the last 8760 hours CBG:  Recent Labs Lab 09/02/12 0800  GLUCAP 82    No results found for this or any previous visit (from the past 240 hour(s)).   Studies: Ct Head Wo Contrast  09/01/2012  *RADIOLOGY REPORT*  Clinical Data: Confusion with altered mental status and hallucinations.  CT HEAD WITHOUT CONTRAST  Technique:  Contiguous axial images were obtained from the base of  the skull through the vertex without contrast.  Comparison: Report only from prior study 12/26/2002.  Findings: There is no evidence of acute intracranial hemorrhage, mass lesion, brain edema or extra-axial fluid collection.  The ventricles and subarachnoid spaces are appropriately sized for age. There is no CT evidence of acute cortical infarction.  A small calcification is noted along the tentorium on the left.  The visualized paranasal sinuses are clear. The calvarium is intact.  IMPRESSION: No acute or significant findings.   Original Report Authenticated By: Carey Bullocks, M.D.    Dg Chest Port 1 View  09/01/2012  *RADIOLOGY REPORT*  Clinical Data: Shortness of breath with shaking.  PORTABLE CHEST - 1 VIEW  Comparison: None.  Findings: 0930 hours. The heart size and mediastinal contours are  normal. The lungs are clear. There is no pleural effusion or pneumothorax. No acute osseous findings are identified.  IMPRESSION: No active cardiopulmonary process.   Original Report Authenticated By: Carey Bullocks, M.D.     Scheduled Meds: . aspirin EC  81 mg Oral Daily  . atenolol  25 mg Oral BID  . B-complex with vitamin C  1 tablet Oral Daily  . folic acid  1 mg Oral Daily  . folic acid  1 mg Oral Daily  . LORazepam  0-4 mg Intravenous Q6H   Followed by  . [START ON 09/03/2012] LORazepam  0-4 mg Intravenous Q12H  . multivitamin with minerals  1 tablet Oral Daily  . niacin  500 mg Oral BID WC  . omega-3 acid ethyl esters  1 g Oral TID  . potassium chloride  40 mEq Oral Once  . sodium chloride  3 mL Intravenous Q12H  . thiamine  100 mg Oral Daily   Or  . thiamine  100 mg Intravenous Daily   Continuous Infusions: . sodium chloride 75 mL/hr at 09/02/12 0753    Principal Problem:   Acute encephalopathy Active Problems:   Alcohol withdrawal   Thrombocytopenia   Hypokalemia    Time spent: 35    [facility], Suleima Ohlendorf  [facility] Pager [phone]. If 8PM-8AM, please contact night-coverage at [email or web address], password [email or web address] 09/02/2012, 9:28 AM  LOS: 1 day

## 2012-09-03 LAB — GLUCOSE, CAPILLARY: Glucose-Capillary: 90 mg/dL (ref 70–99)

## 2012-09-03 MED ORDER — LISINOPRIL 10 MG PO TABS
10.0000 mg | ORAL_TABLET | Freq: Every day | ORAL | Status: DC
  Administered 2012-09-03 – 2012-09-05 (×3): 10 mg via ORAL
  Filled 2012-09-03 (×3): qty 1

## 2012-09-03 MED ORDER — HYDRALAZINE HCL 20 MG/ML IJ SOLN
10.0000 mg | Freq: Four times a day (QID) | INTRAMUSCULAR | Status: DC | PRN
  Administered 2012-09-03 – 2012-09-05 (×2): 10 mg via INTRAVENOUS
  Filled 2012-09-03 (×2): qty 1

## 2012-09-03 NOTE — Progress Notes (Signed)
[facility] PROGRESS NOTE  Mazin Emma BJY:[id number] DOB: 24-Dec-1950 DOA: 09/01/2012 PCP: Coralee Rud, PA  Assessment/Plan: Acute encephalopathy  Secondary to alcohol withdrawal  Alcohol level is within normal limits on admission  Ammonia level 65  CIWA protocol in place- requiring ATC and PRN  Continue supportive care with IV fluids, antiemetics  Lactulose twice a day when necessary  PT evaluation Tremor improved   Thrombocytopenia  Likely secondary to history of alcohol abuse  No signs of active bleed  Continue to monitor   Hypokalemia  Repleted in ED  Followup BMP in the morning   Code Status: full Family Communication: patient at bedside Disposition Plan: home soon   Consultants:    Procedures:    Antibiotics:    HPI/Subjective: Feeling better today- Oriented to person/place/time  Objective: Filed Vitals:   09/02/12 1435 09/02/12 2200 09/03/12 0600 09/03/12 0742  BP: 154/91 148/93 146/90 150/90  Pulse: 78 86 84 69  Temp: 97.4 F (36.3 C) 97.4 F (36.3 C) 98.6 F (37 C)   TempSrc: Oral Oral Oral   Resp: 20 20 20    Height:      Weight:      SpO2: 100% 98% 99%     Intake/Output Summary (Last 24 hours) at 09/03/12 0937 Last data filed at 09/03/12 0934  Gross per 24 hour  Intake    360 ml  Output   1052 ml  Net   -692 ml   Filed Weights   09/01/12 1506 09/02/12 0643  Weight: 81.9 kg (180 lb 8.9 oz) 81.8 kg (180 lb 5.4 oz)    Exam:   General:  A+Ox3, NAD  Cardiovascular: rrr  Respiratory: clear anterior  Abdomen: +BS, Soft, NT/ND  Data Reviewed: Basic Metabolic Panel:  Recent Labs Lab 09/01/12 0911 09/01/12 1557 09/02/12 0450  NA 136 140 140  K 3.3* 4.1 3.2*  CL 93* 99 101  CO2 23 27 22   GLUCOSE 78 80 85  BUN 14 21 18   CREATININE 0.81 0.96 0.58  CALCIUM 9.1 8.9 8.2*  MG 1.7 1.8  --   PHOS 4.1 4.4  --    Liver Function Tests:  Recent Labs Lab 09/01/12 0911 09/01/12 1557 09/02/12 0450  AST 127*  117* 96*  ALT 62* 57* 50  ALKPHOS 136* 121* 104  BILITOT 2.9* 2.3* 1.6*  PROT 7.5 6.9 6.3  ALBUMIN 3.7 3.2* 2.9*   No results found for this basename: LIPASE, AMYLASE,  in the last 168 hours  Recent Labs Lab 09/01/12 1115  AMMONIA 65*   CBC:  Recent Labs Lab 09/01/12 0911 09/01/12 1557 09/02/12 0450  WBC 7.4 6.2 5.2  NEUTROABS 5.8 3.8  --   HGB 16.1 14.6 14.2  HCT 44.6 41.3 39.4  MCV 109.0* 110.7* 111.3*  PLT 96* 95* 86*   Cardiac Enzymes: No results found for this basename: CKTOTAL, CKMB, CKMBINDEX, TROPONINI,  in the last 168 hours BNP (last 3 results) No results found for this basename: PROBNP,  in the last 8760 hours CBG:  Recent Labs Lab 09/02/12 0800 09/03/12 0740  GLUCAP 82 90    No results found for this or any previous visit (from the past 240 hour(s)).   Studies: Ct Head Wo Contrast  09/01/2012  *RADIOLOGY REPORT*  Clinical Data: Confusion with altered mental status and hallucinations.  CT HEAD WITHOUT CONTRAST  Technique:  Contiguous axial images were obtained from the base of the skull through the vertex without contrast.  Comparison: Report only from prior study  12/26/2002.  Findings: There is no evidence of acute intracranial hemorrhage, mass lesion, brain edema or extra-axial fluid collection.  The ventricles and subarachnoid spaces are appropriately sized for age. There is no CT evidence of acute cortical infarction.  A small calcification is noted along the tentorium on the left.  The visualized paranasal sinuses are clear. The calvarium is intact.  IMPRESSION: No acute or significant findings.   Original Report Authenticated By: Carey Bullocks, M.D.    Dg Chest Port 1 View  09/01/2012  *RADIOLOGY REPORT*  Clinical Data: Shortness of breath with shaking.  PORTABLE CHEST - 1 VIEW  Comparison: None.  Findings: 0930 hours. The heart size and mediastinal contours are normal. The lungs are clear. There is no pleural effusion or pneumothorax. No acute  osseous findings are identified.  IMPRESSION: No active cardiopulmonary process.   Original Report Authenticated By: Carey Bullocks, M.D.     Scheduled Meds: . aspirin EC  81 mg Oral Daily  . atenolol  25 mg Oral BID  . B-complex with vitamin C  1 tablet Oral Daily  . folic acid  1 mg Oral Daily  . folic acid  1 mg Oral Daily  . LORazepam  0-4 mg Intravenous Q6H   Followed by  . LORazepam  0-4 mg Intravenous Q12H  . multivitamin with minerals  1 tablet Oral Daily  . niacin  500 mg Oral BID WC  . omega-3 acid ethyl esters  1 g Oral TID  . potassium chloride  40 mEq Oral Once  . sodium chloride  3 mL Intravenous Q12H  . thiamine  100 mg Oral Daily   Or  . thiamine  100 mg Intravenous Daily   Continuous Infusions: . sodium chloride 75 mL/hr at 09/02/12 2120    Principal Problem:   Acute encephalopathy Active Problems:   Alcohol withdrawal   Thrombocytopenia   Hypokalemia    Time spent: 35    [facility], Conleigh Heinlein  [facility] Pager [phone]. If 8PM-8AM, please contact night-coverage at [email or web address], password [facility] 09/03/2012, 9:37 AM  LOS: 2 days

## 2012-09-03 NOTE — Progress Notes (Signed)
Dr.Vann paged in reference to patients elevated blood pressures. RN awaiting response.

## 2012-09-04 LAB — CBC
MCV: 111.3 fL — ABNORMAL HIGH (ref 78.0–100.0)
Platelets: 112 10*3/uL — ABNORMAL LOW (ref 150–400)
RBC: 3.55 MIL/uL — ABNORMAL LOW (ref 4.22–5.81)
WBC: 4.4 10*3/uL (ref 4.0–10.5)

## 2012-09-04 LAB — BASIC METABOLIC PANEL
CO2: 21 mEq/L (ref 19–32)
Chloride: 101 mEq/L (ref 96–112)
Creatinine, Ser: 0.54 mg/dL (ref 0.50–1.35)
Potassium: 3.2 mEq/L — ABNORMAL LOW (ref 3.5–5.1)
Sodium: 136 mEq/L (ref 135–145)

## 2012-09-04 LAB — GLUCOSE, CAPILLARY
Glucose-Capillary: 79 mg/dL (ref 70–99)
Glucose-Capillary: 89 mg/dL (ref 70–99)

## 2012-09-04 MED ORDER — ATENOLOL 50 MG PO TABS
50.0000 mg | ORAL_TABLET | Freq: Two times a day (BID) | ORAL | Status: DC
Start: 2012-09-04 — End: 2012-09-05
  Administered 2012-09-04 – 2012-09-05 (×3): 50 mg via ORAL
  Filled 2012-09-04 (×4): qty 1

## 2012-09-04 NOTE — Progress Notes (Signed)
Clinical Social Work Department BRIEF PSYCHOSOCIAL ASSESSMENT 09/04/2012  Patient:  Ricky Kline, Ricky Kline     Account Number:  [id number]     Admit date:  09/01/2012  Clinical Social Worker:  Doroteo Glassman  Date/Time:  09/04/2012 09:50 AM  Referred by:  Physician  Date Referred:  09/04/2012 Referred for  Substance Abuse   Other Referral:   Interview type:  Patient Other interview type:    PSYCHOSOCIAL DATA Living Status:  ALONE Admitted from facility:   Level of care:   Primary support name:  Isabel Caprice Primary support relationship to patient:  SIBLING Degree of support available:   unknown    CURRENT CONCERNS Current Concerns  Substance Abuse   Other Concerns:    SOCIAL WORK ASSESSMENT / PLAN Met with Pt to discuss current admission and how it relates to his ETOh consumption.    Pt stated that he's not sure how he came to be admitted. He stated that he lasts remembers people at his house and then seeing an additional dog in his room (Pt only has 1 dog).  He stated that he then began having paranoid thoughts, with feeling that people bugged his home with "spy gear."  Pt stated that he doesn't think that these thoughts are real but that he's not sure.  He stated, "I'd have to take you to my house and just show you."    Pt stated that he's been drinking beer since he was 17 and reported his stints at summer camp in his teens as his longest period of sobriety.  Pt stated that he recently quit drinking vodka and that he now consumes 5-6 beers per day.  Pt's last drink was shortly after being admitted to [facility].    Pt is interested in rehab and he stated that his sister is assisting him with locating a facility.  CSW provided Pt with information on [facility] and Pt stated his intent to review [facility]'s program with his sister.  CSW offered to schedule and appt for an assessment on Pt's behalf.  Pt declined, stating that he wants to discuss [facility] with his sister.  Pt understands that he  or his sister can contact [facility] directly to schedule an appt for an assessment.  Pt made aware that admission does not occur at the time of the assessment, rather an appt is schedule at the time of the assessment for Pt to return for admission.    No further CSW needs identified.    CSW thanked Pt for his time.   Assessment/plan status:  No Further Intervention Required Other assessment/ plan:   Information/referral to community resources:   Information given to [employer].    PATIENT'S/FAMILY'S RESPONSE TO PLAN OF CARE: Pt thanked CSW for time and assistance.   Providence Crosby, LCSWA Clinical Social Work [phone]

## 2012-09-04 NOTE — Progress Notes (Signed)
Met with Pt's sister.  Discussed area SA tx options.  Pt's sister to assist Pt in obtaining tx, most likely at [facility].  Pt's sister or another family member to provide transportation for Pt upon d/c.  Pt's sister to call tomorrow to notify what time she is available to transport Pt, as she is assisting her daughter in her wedding preparations.  CSW thanked Pt's sister for her time.  Providence Crosby, LCSWA Clinical Social Work [phone]

## 2012-09-04 NOTE — Progress Notes (Signed)
[facility] PROGRESS NOTE  Ricky Kline ZOX:[id number] DOB: May 17, 1951 DOA: 09/01/2012 PCP: Coralee Rud, PA  Assessment/Plan: Acute encephalopathy  Secondary to alcohol withdrawal  Alcohol level is within normal limits on admission  Ammonia level 65  CIWA protocol in place- d/c ATC and just continue PRN  Continue supportive care with IV fluids, antiemetics  Lactulose twice a day when necessary  PT evaluation Tremor improved   Thrombocytopenia  Likely secondary to history of alcohol abuse  No signs of active bleed  Continue to monitor   Hypokalemia  Repleted in ED   HTN -add hydralazine PRN -lisinopril -increase atenolol      Code Status: full Family Communication: patient at bedside Disposition Plan: home soon   Consultants:    Procedures:    Antibiotics:    HPI/Subjective: Asking for a shower No CP, no SOB  Objective: Filed Vitals:   09/03/12 1651 09/03/12 2100 09/03/12 2200 09/04/12 0500  BP: 172/98 144/76 160/85 167/91  Pulse: 66 97 87 74  Temp:  98.5 F (36.9 C) 98 F (36.7 C) 98.6 F (37 C)  TempSrc:  Oral Oral Oral  Resp:  20 20 20   Height:      Weight:    80.2 kg (176 lb 12.9 oz)  SpO2:  100% 98%     Intake/Output Summary (Last 24 hours) at 09/04/12 0825 Last data filed at 09/03/12 1800  Gross per 24 hour  Intake 695.45 ml  Output    700 ml  Net  -4.55 ml   Filed Weights   09/01/12 1506 09/02/12 0643 09/04/12 0500  Weight: 81.9 kg (180 lb 8.9 oz) 81.8 kg (180 lb 5.4 oz) 80.2 kg (176 lb 12.9 oz)    Exam:   General:  A+Ox3, NAD  Cardiovascular: rrr  Respiratory: clear anterior  Abdomen: +BS, Soft, NT/ND  Data Reviewed: Basic Metabolic Panel:  Recent Labs Lab 09/01/12 0911 09/01/12 1557 09/02/12 0450 09/04/12 0530  NA 136 140 140 136  K 3.3* 4.1 3.2* 3.2*  CL 93* 99 101 101  CO2 23 27 22 21   GLUCOSE 78 80 85 75  BUN 14 21 18 7   CREATININE 0.81 0.96 0.58 0.54  CALCIUM 9.1 8.9 8.2* 8.6  MG 1.7  1.8  --   --   PHOS 4.1 4.4  --   --    Liver Function Tests:  Recent Labs Lab 09/01/12 0911 09/01/12 1557 09/02/12 0450  AST 127* 117* 96*  ALT 62* 57* 50  ALKPHOS 136* 121* 104  BILITOT 2.9* 2.3* 1.6*  PROT 7.5 6.9 6.3  ALBUMIN 3.7 3.2* 2.9*   No results found for this basename: LIPASE, AMYLASE,  in the last 168 hours  Recent Labs Lab 09/01/12 1115  AMMONIA 65*   CBC:  Recent Labs Lab 09/01/12 0911 09/01/12 1557 09/02/12 0450 09/04/12 0530  WBC 7.4 6.2 5.2 4.4  NEUTROABS 5.8 3.8  --   --   HGB 16.1 14.6 14.2 13.6  HCT 44.6 41.3 39.4 39.5  MCV 109.0* 110.7* 111.3* 111.3*  PLT 96* 95* 86* 112*   Cardiac Enzymes: No results found for this basename: CKTOTAL, CKMB, CKMBINDEX, TROPONINI,  in the last 168 hours BNP (last 3 results) No results found for this basename: PROBNP,  in the last 8760 hours CBG:  Recent Labs Lab 09/02/12 0800 09/03/12 0740 09/04/12 0557 09/04/12 0726  GLUCAP 82 90 79 89    No results found for this or any previous visit (from the past 240 hour(s)).  Studies: No results found.  Scheduled Meds: . aspirin EC  81 mg Oral Daily  . atenolol  50 mg Oral BID  . B-complex with vitamin C  1 tablet Oral Daily  . folic acid  1 mg Oral Daily  . folic acid  1 mg Oral Daily  . lisinopril  10 mg Oral Daily  . LORazepam  0-4 mg Intravenous Q12H  . multivitamin with minerals  1 tablet Oral Daily  . niacin  500 mg Oral BID WC  . omega-3 acid ethyl esters  1 g Oral TID  . potassium chloride  40 mEq Oral Once  . sodium chloride  3 mL Intravenous Q12H  . thiamine  100 mg Oral Daily   Or  . thiamine  100 mg Intravenous Daily   Continuous Infusions:    Principal Problem:   Acute encephalopathy Active Problems:   Alcohol withdrawal   Thrombocytopenia   Hypokalemia    Time spent: 35    [facility], [place] Tripathi  [facility] Pager [phone]. If 8PM-8AM, please contact night-coverage at [email or web address], password [facility] 09/04/2012, 8:25 AM   LOS: 3 days

## 2012-09-05 LAB — GLUCOSE, CAPILLARY

## 2012-09-05 MED ORDER — ATENOLOL 50 MG PO TABS: 50.0000 mg | ORAL_TABLET | Freq: Two times a day (BID) | ORAL | Status: AC

## 2012-09-05 MED ORDER — LISINOPRIL 10 MG PO TABS: 10.0000 mg | ORAL_TABLET | Freq: Every day | ORAL | Status: AC

## 2012-09-05 MED ORDER — FOLIC ACID 1 MG PO TABS: 1.0000 mg | ORAL_TABLET | Freq: Every day | ORAL | Status: AC

## 2012-09-05 NOTE — Progress Notes (Signed)
Discharge instructions given to pt, verbalized understanding. Left the unit in stable condition. 

## 2012-09-05 NOTE — Discharge Summary (Signed)
Physician Discharge Summary  Ricky Kline ZOX:[id number] DOB: [birth year] DOA: 09/01/2012  PCP: Coralee Rud, PA  Admit date: 09/01/2012 Discharge date: 09/05/2012  Time spent: 35 minutes  Recommendations for Outpatient Follow-up:  Will need further titration of BP medications BMP 1 week  Discharge Diagnoses:  Principal Problem:   Acute encephalopathy Active Problems:   Alcohol withdrawal   Thrombocytopenia   Hypokalemia   Discharge Condition: improved  Diet recommendation: cardiac  Filed Weights   09/02/12 0643 09/04/12 0500 09/05/12 0518  Weight: 81.8 kg (180 lb 5.4 oz) 80.2 kg (176 lb 12.9 oz) 80.2 kg (176 lb 12.9 oz)    History of present illness:  62 y.o male with past medical history including but not limited to alcohol abuse, drug abuse, hypertension who presented to [facility] ED for altered mental status. Per EMS report, patient was found walking in his underwear down the street. Apparently patient had hallucinations at that time per neighbor report. In ED, patient is more alert. He reports feeling dizzy and with some hallucinations. His last alcoholic drink was last night, 6 beers. At this time patient reports no chest pain, he does have some shortness of breath but no palpitations. No reports of abdominal pain, nausea or vomiting. No fever, cough. No reports of blood in the stool or urine.  In ED, patient was found to have thrombocytopenia with platelet count of 96. His potassium was 3.3.   Hospital Course:  Acute encephalopathy  Secondary to alcohol withdrawal  Alcohol level is within normal limits on admission  Ammonia level 65  CIWA protocol in place- no longer scoring high Continue supportive care with IV fluids, antiemetics  Lactulose twice a day when necessary  PT evaluation  Tremor improved   Thrombocytopenia  Likely secondary to history of alcohol abuse  No signs of active bleed  Continue to monitor   Hypokalemia  Repleted in ED    HTN   -add hydralazine PRN  -lisinopril  -increase atenolol      Procedures:  none  Consultations:  none  Discharge Exam: Filed Vitals:   09/04/12 1935 09/04/12 2128 09/05/12 0518 09/05/12 0734  BP: 138/85 158/98 166/89 155/85  Pulse: 106 71 74   Temp:  99 F (37.2 C) 98.2 F (36.8 C)   TempSrc:  Oral Oral   Resp:  20 20   Height:      Weight:   80.2 kg (176 lb 12.9 oz)   SpO2:  98% 96%     General: A+Ox3, NAD Cardiovascular: rrr Respiratory: clear anterior  Discharge Instructions      Discharge Orders   Future Orders Complete By Expires     Diet - low sodium heart healthy  As directed     Discharge instructions  As directed     Comments:      [place] with sister    Increase activity slowly  As directed         Medication List    TAKE these medications       aspirin EC 81 MG tablet  Take 81 mg by mouth daily.     atenolol 50 MG tablet  Commonly known as:  TENORMIN  Take 1 tablet (50 mg total) by mouth 2 (two) times daily.     B-complex with vitamin C tablet  Take 1 tablet by mouth daily.     fish oil-omega-3 fatty acids 1000 MG capsule  Take 1 g by mouth 3 (three) times daily.     folic  acid 1 MG tablet  Commonly known as:  FOLVITE  Take 1 tablet (1 mg total) by mouth daily.     lisinopril 10 MG tablet  Commonly known as:  PRINIVIL,ZESTRIL  Take 1 tablet (10 mg total) by mouth daily.     multivitamin with minerals Tabs  Take 1 tablet by mouth daily.     niacin 500 MG tablet  Take 500 mg by mouth 2 (two) times daily with a meal.          The results of significant diagnostics from this hospitalization (including imaging, microbiology, ancillary and laboratory) are listed below for reference.    Significant Diagnostic Studies: Ct Head Wo Contrast  09/01/2012  *RADIOLOGY REPORT*  Clinical Data: Confusion with altered mental status and hallucinations.  CT HEAD WITHOUT CONTRAST  Technique:  Contiguous axial images were obtained from the  base of the skull through the vertex without contrast.  Comparison: Report only from prior study 12/26/2002.  Findings: There is no evidence of acute intracranial hemorrhage, mass lesion, brain edema or extra-axial fluid collection.  The ventricles and subarachnoid spaces are appropriately sized for age. There is no CT evidence of acute cortical infarction.  A small calcification is noted along the tentorium on the left.  The visualized paranasal sinuses are clear. The calvarium is intact.  IMPRESSION: No acute or significant findings.   Original Report Authenticated By: Carey Bullocks, M.D.    Dg Chest Port 1 View  09/01/2012  *RADIOLOGY REPORT*  Clinical Data: Shortness of breath with shaking.  PORTABLE CHEST - 1 VIEW  Comparison: None.  Findings: 0930 hours. The heart size and mediastinal contours are normal. The lungs are clear. There is no pleural effusion or pneumothorax. No acute osseous findings are identified.  IMPRESSION: No active cardiopulmonary process.   Original Report Authenticated By: Carey Bullocks, M.D.     Microbiology: No results found for this or any previous visit (from the past 240 hour(s)).   Labs: Basic Metabolic Panel:  Recent Labs Lab 09/01/12 0911 09/01/12 1557 09/02/12 0450 09/04/12 0530  NA 136 140 140 136  K 3.3* 4.1 3.2* 3.2*  CL 93* 99 101 101  CO2 23 27 22 21   GLUCOSE 78 80 85 75  BUN 14 21 18 7   CREATININE 0.81 0.96 0.58 0.54  CALCIUM 9.1 8.9 8.2* 8.6  MG 1.7 1.8  --   --   PHOS 4.1 4.4  --   --    Liver Function Tests:  Recent Labs Lab 09/01/12 0911 09/01/12 1557 09/02/12 0450  AST 127* 117* 96*  ALT 62* 57* 50  ALKPHOS 136* 121* 104  BILITOT 2.9* 2.3* 1.6*  PROT 7.5 6.9 6.3  ALBUMIN 3.7 3.2* 2.9*   No results found for this basename: LIPASE, AMYLASE,  in the last 168 hours  Recent Labs Lab 09/01/12 1115  AMMONIA 65*   CBC:  Recent Labs Lab 09/01/12 0911 09/01/12 1557 09/02/12 0450 09/04/12 0530  WBC 7.4 6.2 5.2 4.4   NEUTROABS 5.8 3.8  --   --   HGB 16.1 14.6 14.2 13.6  HCT 44.6 41.3 39.4 39.5  MCV 109.0* 110.7* 111.3* 111.3*  PLT 96* 95* 86* 112*   Cardiac Enzymes: No results found for this basename: CKTOTAL, CKMB, CKMBINDEX, TROPONINI,  in the last 168 hours BNP: BNP (last 3 results) No results found for this basename: PROBNP,  in the last 8760 hours CBG:  Recent Labs Lab 09/02/12 0800 09/03/12 0740 09/04/12 0557 09/04/12 0726 09/05/12  0718  GLUCAP 82 90 79 89 98       Signed:  Kwame Ryland  [facility] 09/05/2012, 9:51 AM

## 2013-07-19 ENCOUNTER — Other Ambulatory Visit: Payer: Self-pay | Admitting: Orthopedic Surgery

## 2013-07-19 DIAGNOSIS — M545 Low back pain, unspecified: Secondary | ICD-10-CM

## 2013-07-24 ENCOUNTER — Inpatient Hospital Stay
Admission: RE | Admit: 2013-07-24 | Discharge: 2013-07-24 | Disposition: A | Discharge status: Home / Self Care | Payer: Self-pay | Source: Ambulatory Visit | Attending: Orthopedic Surgery | Admitting: Orthopedic Surgery

## 2013-07-24 ENCOUNTER — Other Ambulatory Visit: Payer: Self-pay | Admitting: Orthopedic Surgery

## 2013-07-24 ENCOUNTER — Ambulatory Visit
Admission: RE | Admit: 2013-07-24 | Discharge: 2013-07-24 | Disposition: A | Discharge status: Home / Self Care | Payer: No Typology Code available for payment source | Source: Ambulatory Visit | Attending: Orthopedic Surgery | Admitting: Orthopedic Surgery

## 2013-07-24 VITALS — BP 165/90 | HR 62

## 2013-07-24 DIAGNOSIS — R52 Pain, unspecified: Secondary | ICD-10-CM

## 2013-07-24 DIAGNOSIS — M545 Low back pain, unspecified: Secondary | ICD-10-CM

## 2013-07-24 MED ORDER — ONDANSETRON HCL 4 MG/2ML IJ SOLN
4.0000 mg | Freq: Once | INTRAMUSCULAR | Status: AC
  Administered 2013-07-24: 4 mg via INTRAMUSCULAR

## 2013-07-24 MED ORDER — DIAZEPAM 5 MG PO TABS
10.0000 mg | ORAL_TABLET | Freq: Once | ORAL | Status: AC
  Administered 2013-07-24: 10 mg via ORAL

## 2013-07-24 MED ORDER — HYDROMORPHONE HCL PF 2 MG/ML IJ SOLN
1.5000 mg | Freq: Once | INTRAMUSCULAR | Status: AC
  Administered 2013-07-24: 1.5 mg via INTRAMUSCULAR

## 2013-07-24 MED ORDER — IOHEXOL 180 MG/ML  SOLN
15.0000 mL | Freq: Once | INTRAMUSCULAR | Status: AC | PRN
  Administered 2013-07-24: 15 mL via INTRATHECAL

## 2013-07-24 NOTE — Discharge Instructions (Signed)

## 2015-11-09 IMAGING — CT CT L SPINE W/ CM
4 of 10 series · 12 of 33 positions shown, 14 images · non-contrast
Comparison: MRI of lumbar spine at [REDACTED]
dated 05/27/2013.

CLINICAL DATA: Low back pain extending into the right lower
extremity. The pain is centered at the L5 and S1 dermatomes.

EXAM:
LUMBAR MYELOGRAM
TECHNIQUE: Contiguous axial images were obtained through the Lumbar spine after
the intrathecal infusion of infusion. Coronal and sagittal
reconstructions were obtained of the axial image sets.

[Series 2: l spine bone · axial · 0.27mm/px · z∈[-3,+72]mm · 2 of 90 slices shown, 3 images]
[im 30/90  soft-tissue]
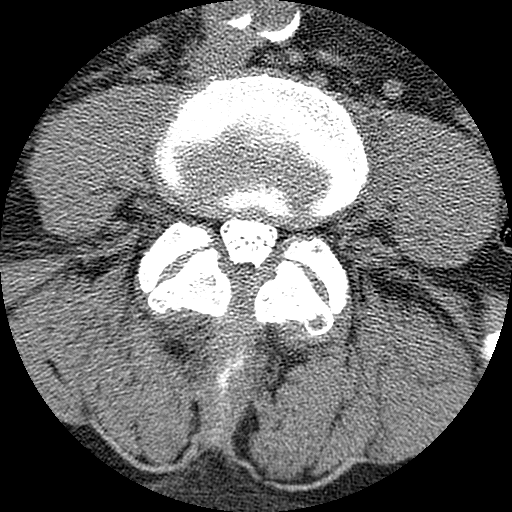
[im 30/90  bone]
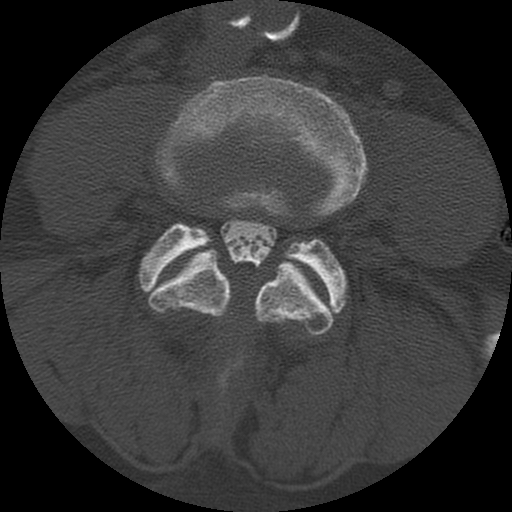
[im 60/90  bone]
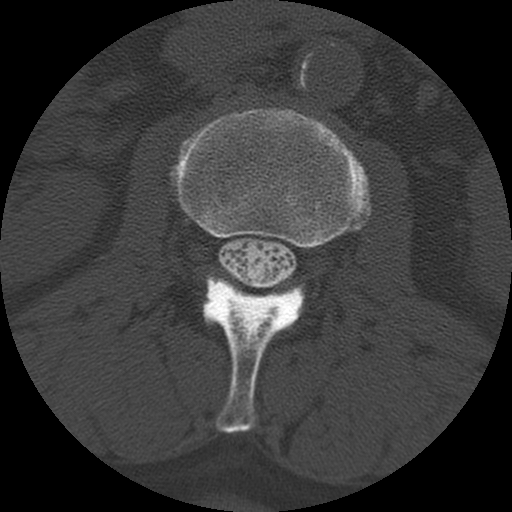

[Series 3: l spine soft · axial · 0.27mm/px · z∈[-3,+72]mm · 2 of 90 slices shown]
[im 30/90  soft-tissue]
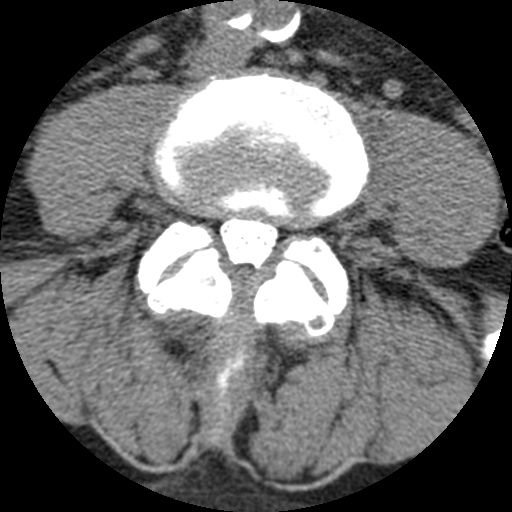
[im 60/90  soft-tissue]
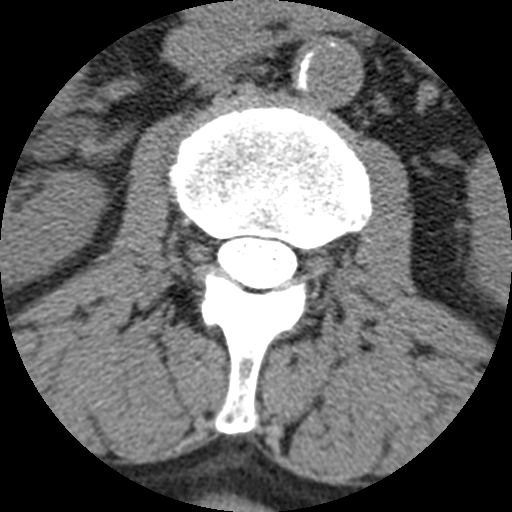

[Series 400: sagittal · sagittal · 0.45mm/px · 5 of 53 slices shown, 6 images]
[im 18/53  bone]
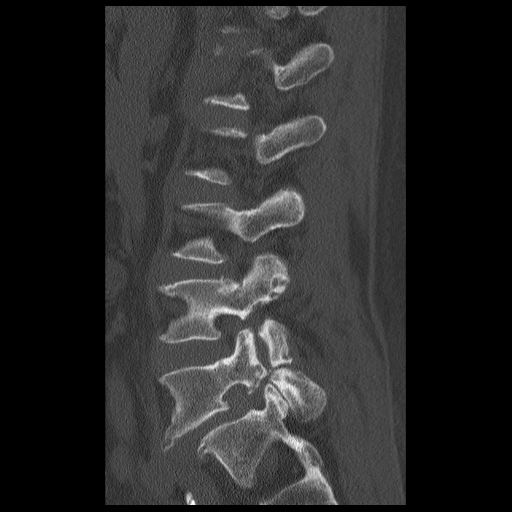
[im 22/53  bone]
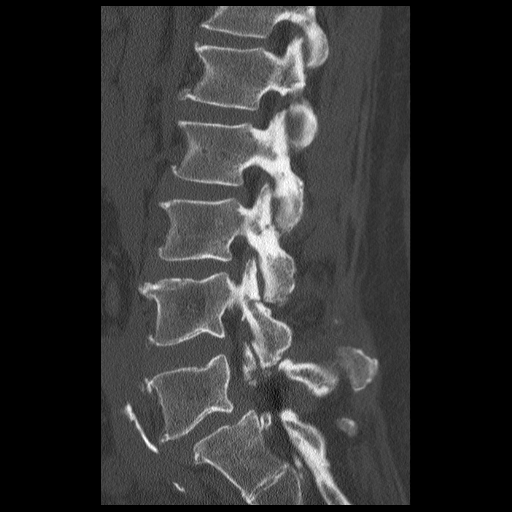
[im 27/53  soft-tissue]
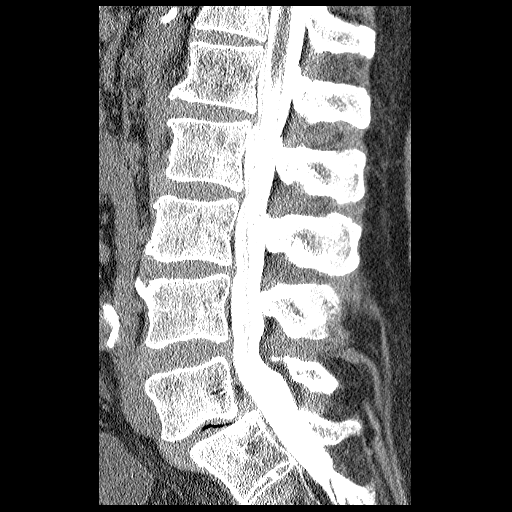
[im 27/53  bone]
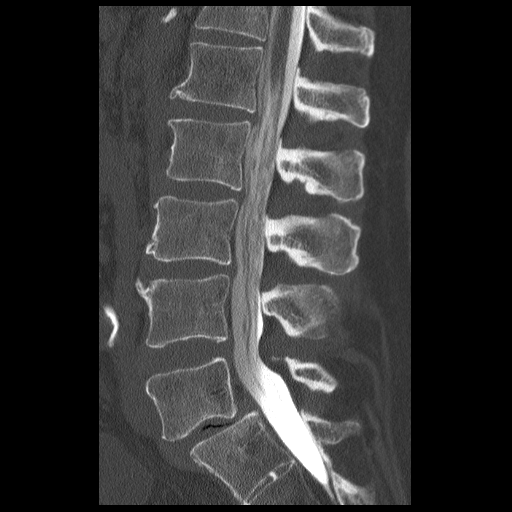
[im 31/53  bone]
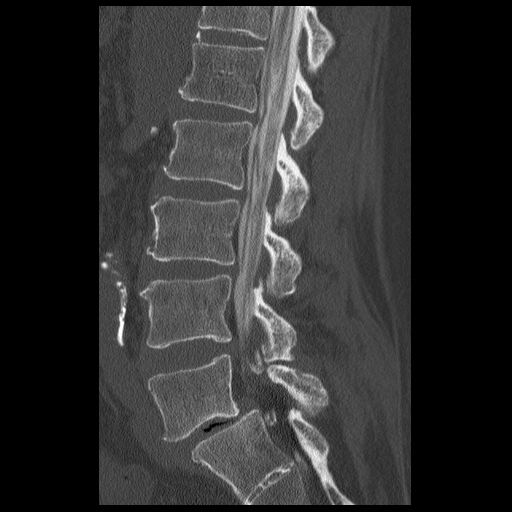
[im 35/53  bone]
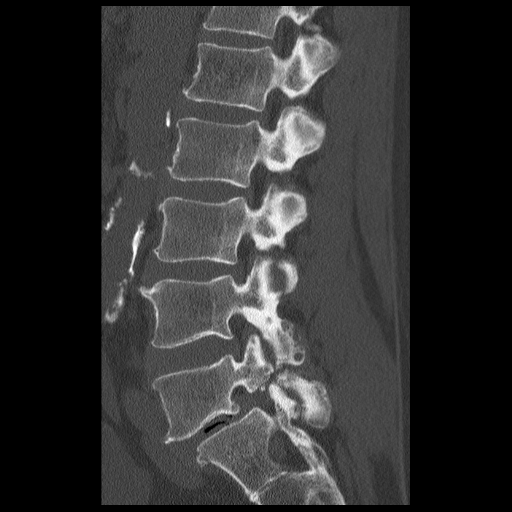

[Series 401: coronal · coronal · 0.44mm/px · 3 of 57 slices shown]
[im 12/57  bone]
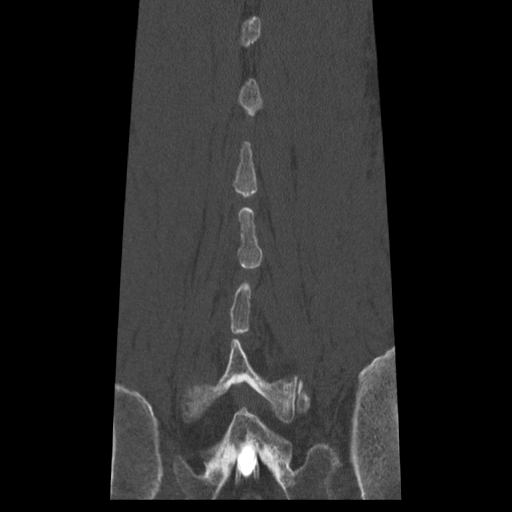
[im 23/57  bone]
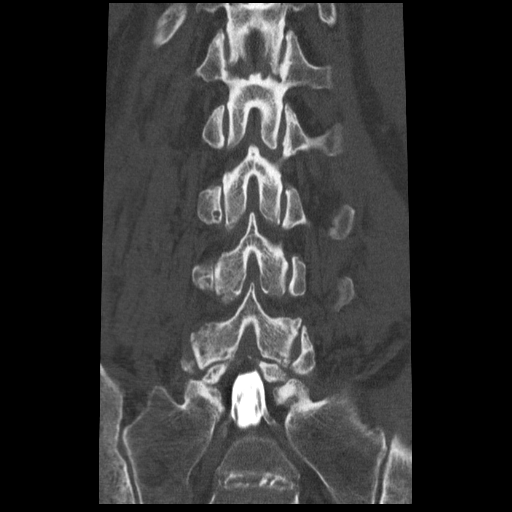
[im 34/57  bone]
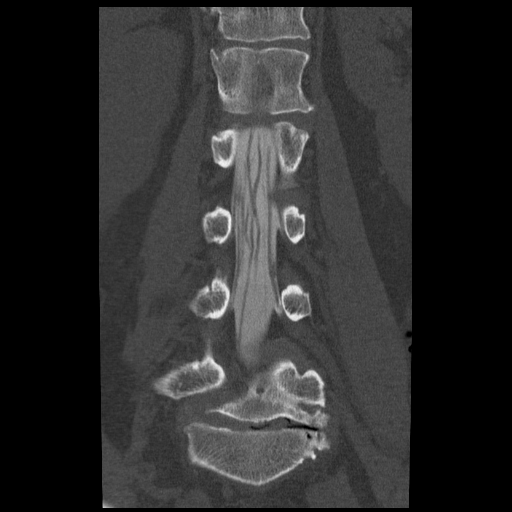

[12 of 33 positions shown; findings below may reference images not displayed]

FLUOROSCOPY TIME:  1 min 31 seconds

PROCEDURE:
After thorough discussion of risks and benefits of the procedure
including bleeding, infection, injury to nerves, blood vessels,
adjacent structures as well as headache and CSF leak, written and
oral informed consent was obtained. Consent was obtained by Dr.
Odd Harald Vang. Time out form was completed.

Patient was positioned prone on the fluoroscopy table. Local
anesthesia was provided with 1% lidocaine without epinephrine after
prepped and draped in the usual sterile fashion. Puncture was
performed at L2-3 using a 3 1/2 inch 22-gauge spinal needle via
right paramedian approach. Using a single pass through the dura, the
needle was placed within the thecal sac, with return of clear CSF.
15 mL of Zmnipaque-P1T was injected into the thecal sac, with normal
opacification of the nerve roots and cauda equina consistent with
free flow within the subarachnoid space.

I personally performed the lumbar puncture and administered the
intrathecal contrast. I also personally supervised acquisition of
the myelogram images.
FINDINGS: LUMBAR MYELOGRAM FINDINGS:

Grade 1 retrolisthesis at L4-5 and grade 1 anterolisthesis at L5-S1
is associated with bilateral pars defects. Slight retrolisthesis and
disc bulging is present at L1-2. Minimal disc bulging is noted at
L2-3 and to a greater extent at L3-4. There is early truncation of
the L5 nerve roots bilaterally.

The upright images demonstrate no significant change in the
listhesis at L4-5 and L5-S1. The retrolisthesis at L4-5 is reduced
in flexion. And is slightly worse and extension. The disc
herniations do not change.

CT LUMBAR MYELOGRAM FINDINGS:

The lumbar spine is imaged from the midbody of T12 through the
midbody of L2. Grade 1 anterolisthesis at L5-S1 measures 8 mm.
Slight retrolisthesis is noted at L4-5. Vertebral body heights and
alignment are otherwise normal. There is slight leftward curvature
of the lumbar spine. The conus medullaris terminates at L1-2.
Atherosclerotic calcifications are present in the abdominal aorta
and branch vessels without aneurysm. Limited imaging of the abdomen
is otherwise unremarkable. Note is again made of a prominent
hemangioma at L1. Chronic endplate marrow changes are evident at
L5-S1, left greater than right. A hemangioma on the right at L4 is
again noted.

T12-L1:  Negative.

L1-2: Mild disc bulging is evident without significant stenosis.
Mild facet hypertrophy is present.

L2-3: A mild broad-based disc bulge is present. Mild to moderate
facet hypertrophy is noted. There is no significant stenosis.

L3-4: A mild broad-based disc herniation is evident. Disc herniates
into the neural foramina bilaterally. Mild lateral recess and
foraminal narrowing is stable.

L4-5: A broad-based disc herniation is present. Advanced facet
degenerative changes are present. The combination leads to mild to
moderate lateral recess narrowing bilaterally, right greater than
left. Mild foraminal narrowing is slightly worse on the left.

L5-S1: Central canal is patent. A broad-based disc herniation
extends and of both neural foramina a with moderate to severe
foraminal stenosis bilaterally. Advanced facet arthropathy is
evident.
IMPRESSION: LUMBAR MYELOGRAM IMPRESSION:

1. Bilateral pars defects at L5.
2. Dynamic retrolisthesis at L4-5.
3. Grade 1 anterolisthesis at L5-S1 does not change significantly.
4. Early truncation of the L5 nerve roots bilaterally.
5. Broad-based disc herniation at L3-4 without significant stenosis.

CT LUMBAR MYELOGRAM IMPRESSION:

1. Bilateral L5 pars defects.
2. Severe foraminal stenosis bilaterally at L5-S1, right greater
than left.
3. Grade 1 anterolisthesis at L5-S1 without significant central
canal stenosis.
4. Broad-based disc herniation and facet arthropathy at L4-5 leads
to mild to moderate lateral recess narrowing at L4-5, right greater
than left. This potentially affects the L5 nerve roots.
5. Mild foraminal narrowing bilaterally at L4-5 is worse on the
left.
6. Mild broad-based disc herniation and facet hypertrophy at L3-4
leads to mild lateral recess and foraminal narrowing bilaterally.
7. Mild disc bulging and facet hypertrophy at L1-2 and L2-3 without
significant stenoses.
